# Patient Record
Sex: Male | Born: 1990 | Race: Black or African American | Hispanic: No | Marital: Single | Smoking: Never smoker
Health system: Southern US, Community
[De-identification: ages and names within clinical notes are randomized; demographics above are authoritative.]

## PROBLEM LIST (undated history)

## (undated) DIAGNOSIS — R011 Cardiac murmur, unspecified: Secondary | ICD-10-CM

---

## 2011-01-18 ENCOUNTER — Other Ambulatory Visit: Payer: Self-pay

## 2011-01-18 ENCOUNTER — Emergency Department (HOSPITAL_COMMUNITY)
Admission: EM | Admit: 2011-01-18 | Discharge: 2011-01-19 | Disposition: A | Discharge status: Home / Self Care | Payer: BC Managed Care – PPO | Attending: Emergency Medicine | Admitting: Emergency Medicine

## 2011-01-18 ENCOUNTER — Encounter (HOSPITAL_COMMUNITY): Payer: Self-pay

## 2011-01-18 DIAGNOSIS — R079 Chest pain, unspecified: Secondary | ICD-10-CM | POA: Insufficient documentation

## 2011-01-18 DIAGNOSIS — R0789 Other chest pain: Secondary | ICD-10-CM

## 2011-01-18 DIAGNOSIS — R071 Chest pain on breathing: Secondary | ICD-10-CM | POA: Insufficient documentation

## 2011-01-18 HISTORY — DX: Cardiac murmur, unspecified: R01.1

## 2011-01-18 NOTE — ED Notes (Signed)
Pt st's he got a sharp pain in his upper abdomen/lower chest right after eating fish and fries, st's the pain was sharp in nature, radiated up to his L shoulder and went away seconds later.  Reports no SOB, no n/v, no dizziness or lightheadedness.  Pt is not in any pain right now, NAD.

## 2011-01-19 ENCOUNTER — Emergency Department (HOSPITAL_COMMUNITY): Payer: BC Managed Care – PPO

## 2011-01-19 NOTE — ED Provider Notes (Signed)
History     CSN: [id number]  Arrival date & time 01/18/11  2222   First MD Initiated Contact with Patient 01/19/11 0025      Chief Complaint  Patient presents with  . Chest Pain    x 1 episode. was watching TV. sharp pain after eating fried seafood. no other sx's.  currently denies pain or other sx's    (Consider location/radiation/quality/duration/timing/severity/associated sxs/prior treatment) Patient is a 21 y.o. male presenting with chest pain. The history is provided by the patient. No language interpreter was used.  Chest Pain The chest pain began less than 1 hour ago. Chest pain occurs rarely. The chest pain is resolved. The pain is associated with eating. At its most intense, the pain is at 8/10. The pain is currently at 0/10. The quality of the pain is described as sharp. The pain does not radiate. Exacerbated by: nothing. Pertinent negatives for primary symptoms include no fever, no fatigue, no syncope, no shortness of breath, no cough, no wheezing, no palpitations, no abdominal pain, no nausea, no vomiting, no dizziness and no altered mental status.  Pertinent negatives for associated symptoms include no lower extremity edema. He tried nothing for the symptoms. Risk factors include obesity.  Pertinent negatives for past medical history include no Marfan's syndrome.  Pertinent negatives for family medical history include: no Marfan's syndrome in family.  Procedure history is negative for cardiac catheterization.   PERC negative, no leg pain or swelling no long car trips or plane trips.    Past Medical History  Diagnosis Date  . Murmur, heart     mother states was tested as a child-unsure if he has or not.    History reviewed. No pertinent past surgical history.  History reviewed. No pertinent family history.  History  Substance Use Topics  . Smoking status: Never Smoker   . Smokeless tobacco: Not on file  . Alcohol Use: No      Review of Systems    Constitutional: Negative for fever and fatigue.  HENT: Negative.   Eyes: Negative.   Respiratory: Negative for cough, shortness of breath and wheezing.   Cardiovascular: Positive for chest pain. Negative for palpitations and syncope.  Gastrointestinal: Negative for nausea, vomiting, abdominal pain and abdominal distention.  Genitourinary: Negative.   Musculoskeletal: Negative.   Neurological: Negative for dizziness and syncope.  Hematological: Negative.   Psychiatric/Behavioral: Negative.  Negative for altered mental status.    Allergies  Review of patient's allergies indicates no known allergies.  Home Medications  No current outpatient prescriptions on file.  BP 146/92  Pulse 95  Temp(Src) 99.1 F (37.3 C) (Oral)  Resp 18  Ht 6\' 1"  (1.854 m)  Wt 275 lb (124.739 kg)  BMI 36.28 kg/m2  SpO2 100%  Physical Exam  Constitutional: He is oriented to person, place, and time. He appears well-developed and well-nourished. No distress.  HENT:  Head: Normocephalic and atraumatic.  Mouth/Throat: Oropharynx is clear and moist.  Eyes: Conjunctivae are normal. Pupils are equal, round, and reactive to light.  Neck: Normal range of motion. Neck supple. No JVD present.  Cardiovascular: Normal rate and regular rhythm.   Pulmonary/Chest: Effort normal and breath sounds normal. He has no wheezes. He has no rales.  Abdominal: Soft. Bowel sounds are normal. There is no tenderness. There is no rebound and no guarding.  Musculoskeletal: Normal range of motion. He exhibits no tenderness.  Neurological: He is alert and oriented to person, place, and time.  Skin: Skin is warm  and dry. He is not diaphoretic.  Psychiatric: Thought content normal.    ED Course  Procedures (including critical care time)  Labs Reviewed - No data to display No results found.   No diagnosis found.    MDM   Date: 01/19/2011  Rate: 91  Rhythm: normal sinus rhythm  QRS Axis: normal  Intervals: normal  ST/T  Wave abnormalities: normal  Conduction Disutrbances:none  Narrative Interpretation:   Old EKG Reviewed: none available        Return for chest pain shortness of breath or any concerns.  Mom and patient verbalize understanding and agree to follow up  [name] K Marella Vanderpol-Rasch, MD 01/19/11 [phone]

## 2013-01-18 IMAGING — CR DG CHEST 2V
2 series · 2 of 2 positions shown · non-contrast
Comparison: None

CLINICAL DATA: Left chest cramping

CHEST - 2 VIEW

[w chest pa]
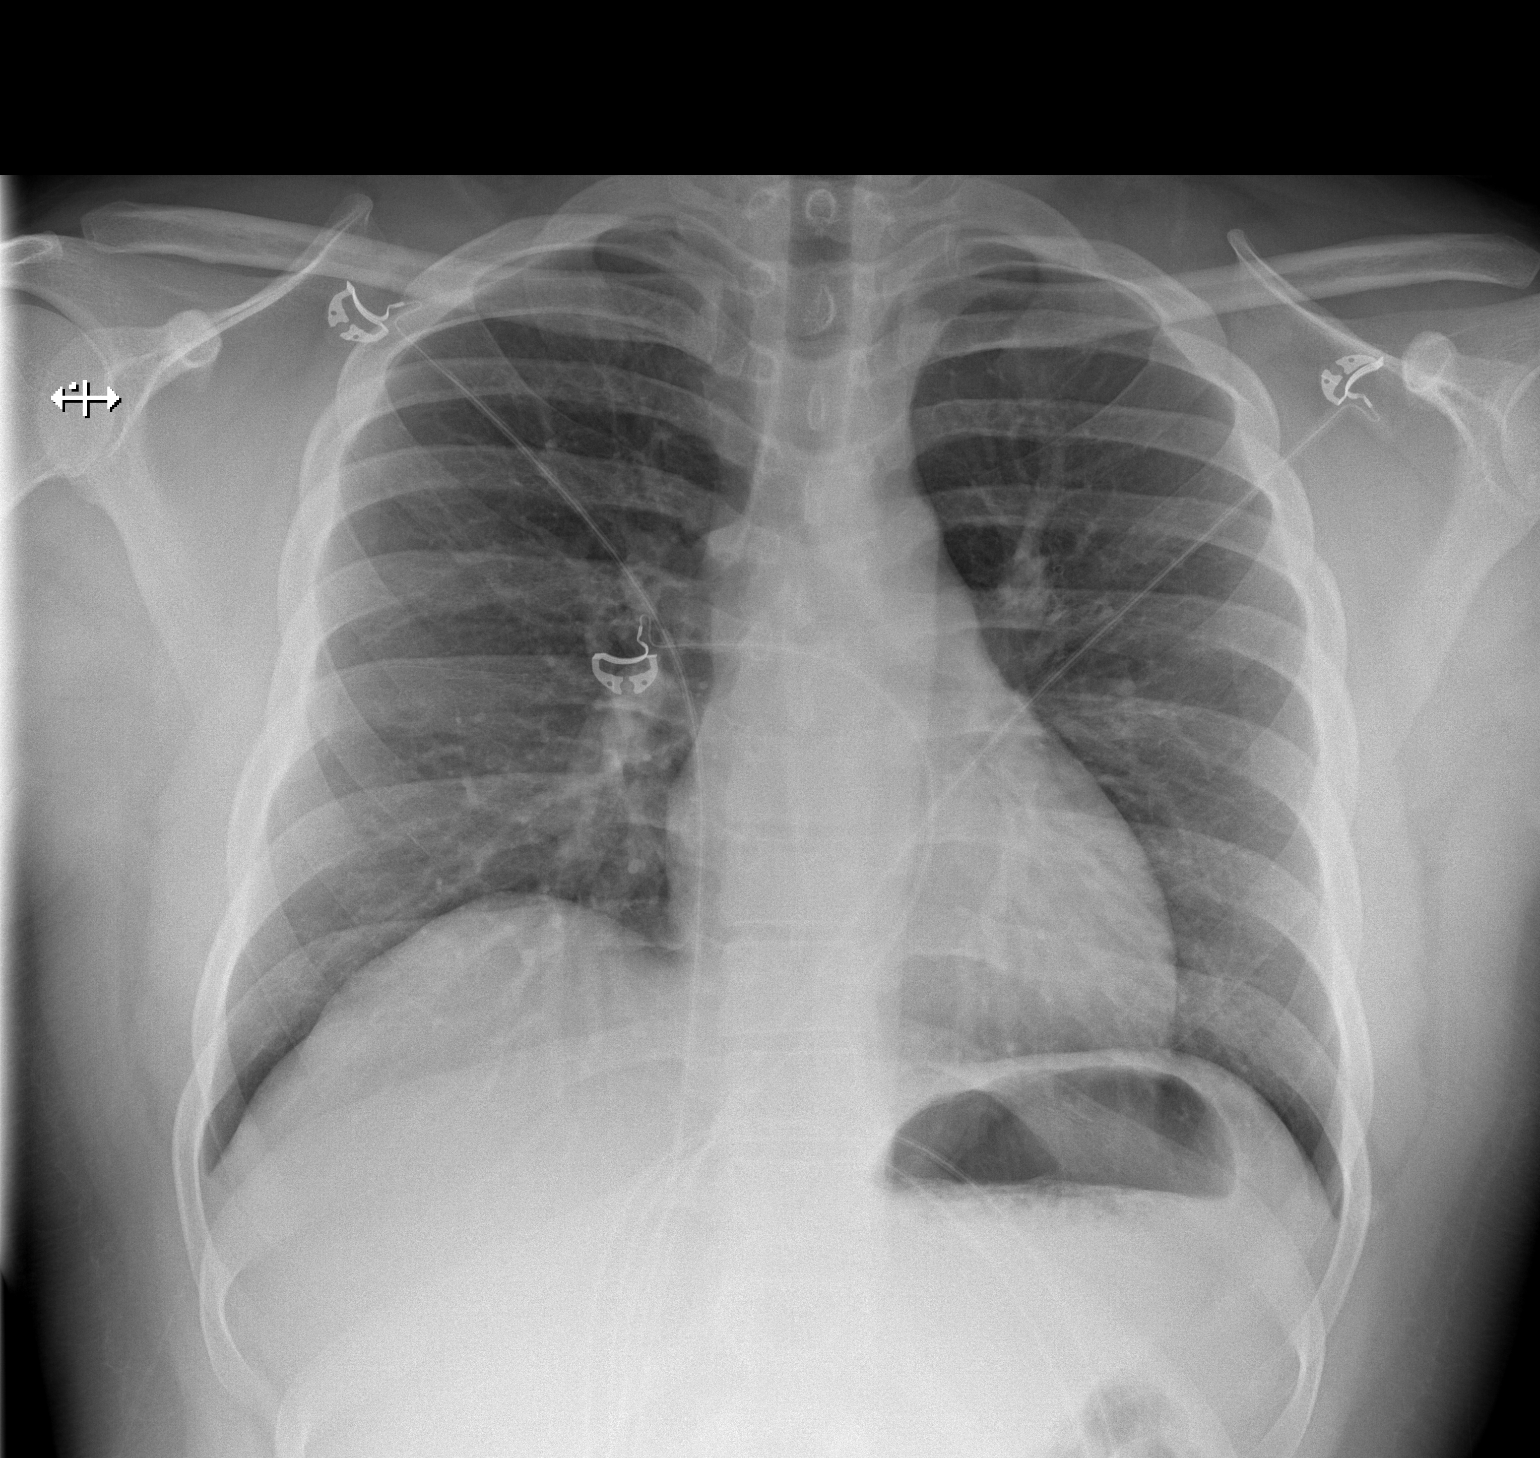

[w chest lat]
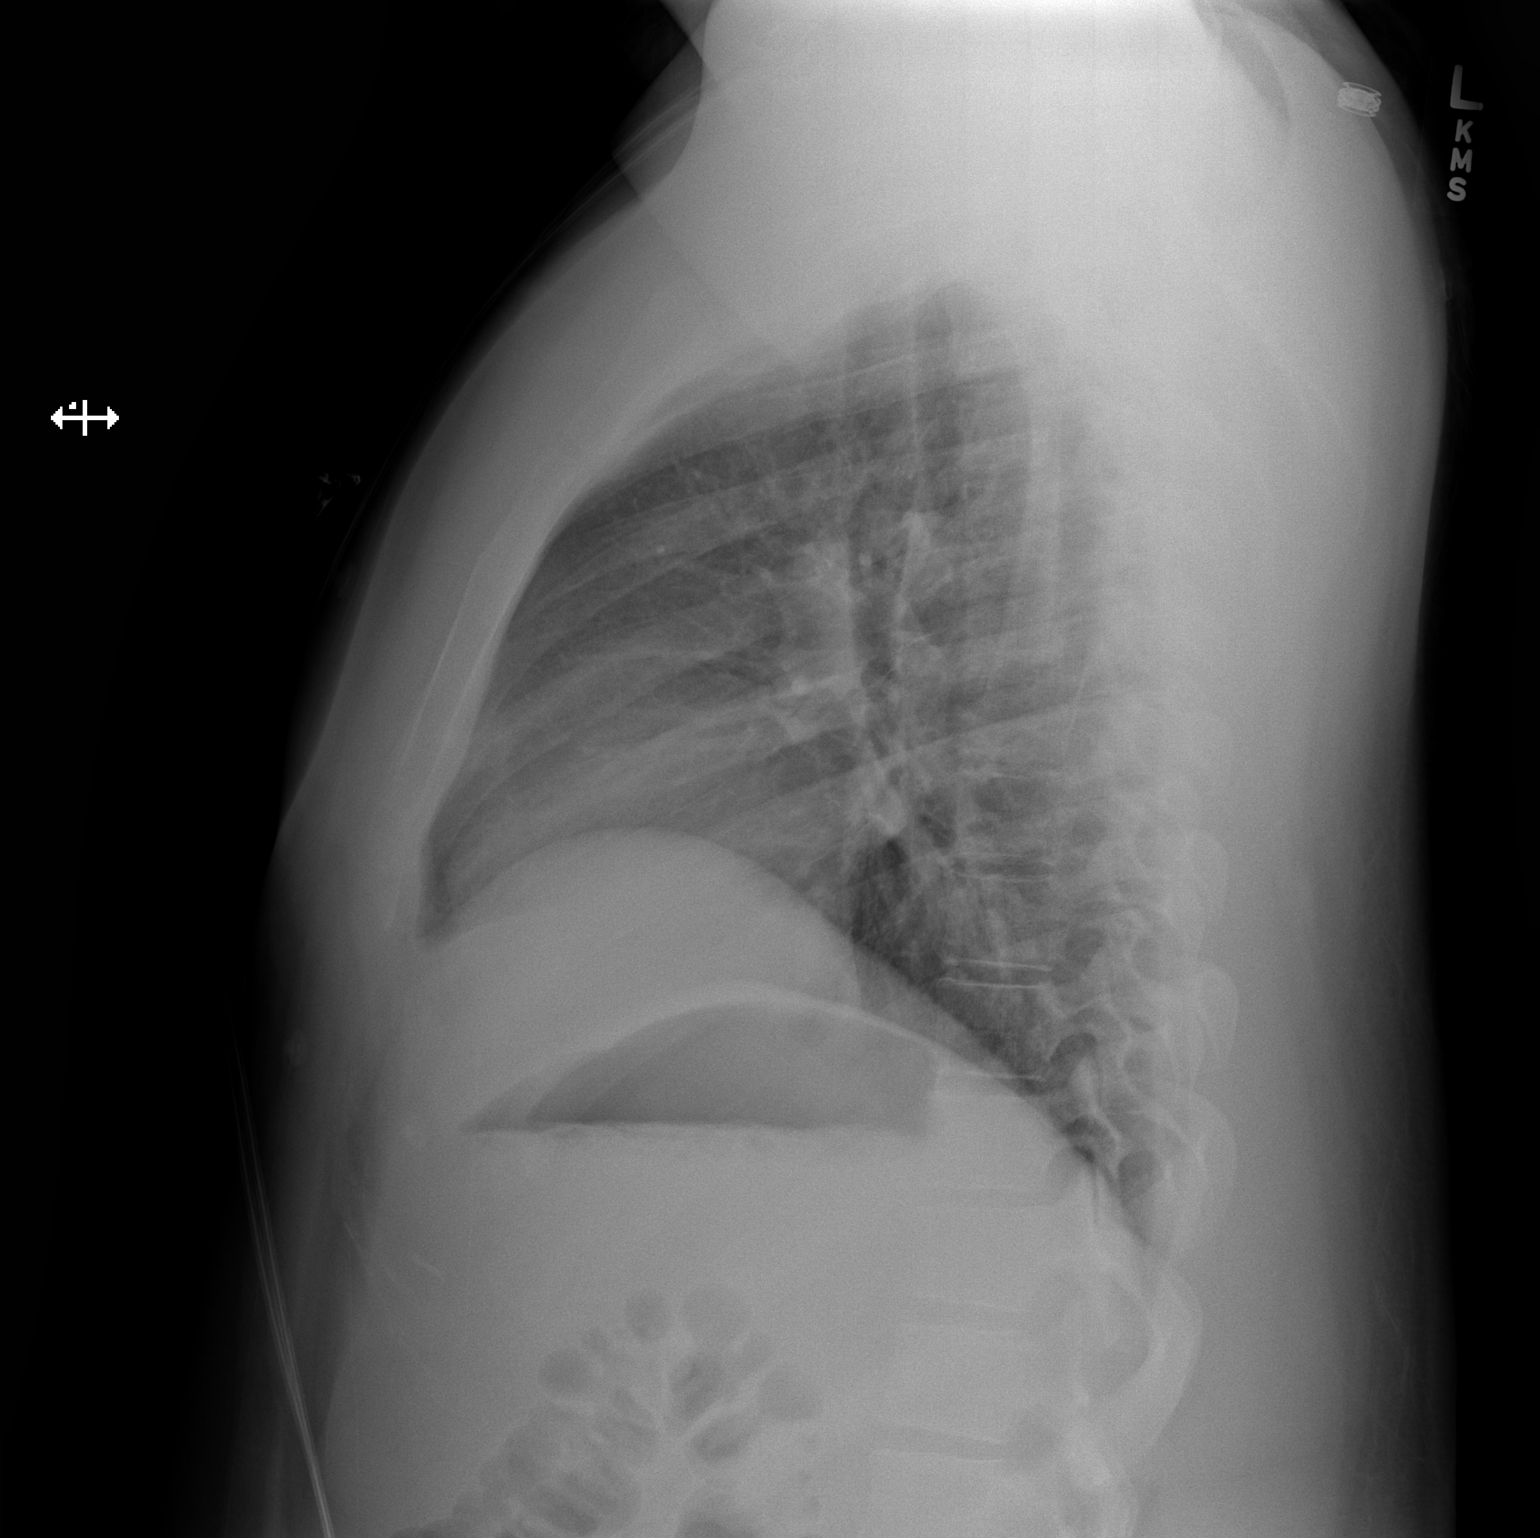

[2 of 2 positions shown; findings below may reference images not displayed]

FINDINGS: Normal heart size, mediastinal contours, and pulmonary vascularity.
Lungs clear.
No pleural effusion or pneumothorax.
Bones unremarkable.
IMPRESSION: No acute abnormalities.

## 2014-06-19 ENCOUNTER — Other Ambulatory Visit: Payer: Self-pay | Admitting: Urology

## 2021-03-06 ENCOUNTER — Encounter: Payer: Self-pay | Admitting: Emergency Medicine

## 2021-03-06 ENCOUNTER — Ambulatory Visit
Admission: EM | Admit: 2021-03-06 | Discharge: 2021-03-06 | Disposition: A | Discharge status: Home / Self Care | Payer: Self-pay | Attending: Emergency Medicine | Admitting: Emergency Medicine

## 2021-03-06 ENCOUNTER — Other Ambulatory Visit: Payer: Self-pay

## 2021-03-06 DIAGNOSIS — K05219 Aggressive periodontitis, localized, unspecified severity: Secondary | ICD-10-CM

## 2021-03-06 DIAGNOSIS — K047 Periapical abscess without sinus: Secondary | ICD-10-CM

## 2021-03-06 MED ORDER — PENICILLIN V POTASSIUM 500 MG PO TABS: 500.0000 mg | ORAL_TABLET | Freq: Three times a day (TID) | ORAL | 0 refills | Status: AC

## 2021-03-06 MED ORDER — IBUPROFEN 800 MG PO TABS: 800.0000 mg | ORAL_TABLET | Freq: Three times a day (TID) | ORAL | 0 refills | Status: DC | PRN

## 2021-03-06 NOTE — ED Triage Notes (Signed)
Pt c/o right lower dental pain since yesterday. Took home remedy to help with pain

## 2021-03-06 NOTE — ED Provider Notes (Signed)
[facility]    CSN: [id number] Arrival date & time: 03/06/21  1044    HISTORY   Chief Complaint  Patient presents with   Dental Pain   HPI Tyler Byrd is a pleasant a 31 y.o. male. Patient complains in his right lower first molar with some mild swelling of his gums around that same tooth.  Patient states he tried some home remedies for his pain without relief.  Patient denies fever, aches, chills, facial swelling, pain with chewing, broken tooth.  Patient states he does not currently have a dentist but recently obtained dental insurance and is looking for one this time.  The history is provided by the patient.  Past Medical History:  Diagnosis Date   Murmur, heart    mother states was tested as a child-unsure if he has or not.   There are no problems to display for this patient.  History reviewed. No pertinent surgical history.  Home Medications    Prior to Admission medications   Not on File   Family History No family history on file. Social History Social History   Tobacco Use   Smoking status: Never  Substance Use Topics   Alcohol use: No   Drug use: No   Allergies   Patient has no known allergies.  Review of Systems Review of Systems Pertinent findings noted in history of present illness.   Physical Exam Triage Vital Signs ED Triage Vitals  Enc Vitals Group     BP 11/03/20 0827 (!) 147/82     Pulse Rate 11/03/20 0827 72     Resp 11/03/20 0827 18     Temp 11/03/20 0827 98.3 F (36.8 C)     Temp Source 11/03/20 0827 Oral     SpO2 11/03/20 0827 98 %     Weight --      Height --      Head Circumference --      Peak Flow --      Pain Score 11/03/20 0826 5     Pain Loc --      Pain Edu? --      Excl. in [place]? --   No data found.  Updated Vital Signs BP 139/87 (BP Location: Left Arm)    Pulse 84    Temp 98.7 F (37.1 C) (Oral)    Resp 17    SpO2 98%   Physical Exam Vitals and nursing note reviewed.  Constitutional:      General: He  is not in acute distress.    Appearance: Normal appearance. He is not ill-appearing.  HENT:     Head: Normocephalic and atraumatic.     Mouth/Throat:   Eyes:     General: Lids are normal.        Right eye: No discharge.        Left eye: No discharge.     Extraocular Movements: Extraocular movements intact.     Conjunctiva/sclera: Conjunctivae normal.     Right eye: Right conjunctiva is not injected.     Left eye: Left conjunctiva is not injected.  Neck:     Trachea: Trachea and phonation normal.  Cardiovascular:     Rate and Rhythm: Normal rate and regular rhythm.     Pulses: Normal pulses.     Heart sounds: Normal heart sounds. No murmur heard.   No friction rub. No gallop.  Pulmonary:     Effort: Pulmonary effort is normal. No accessory muscle usage, prolonged expiration or respiratory distress.  Breath sounds: Normal breath sounds. No stridor, decreased air movement or transmitted upper airway sounds. No decreased breath sounds, wheezing, rhonchi or rales.  Chest:     Chest wall: No tenderness.  Musculoskeletal:        General: Normal range of motion.     Cervical back: Normal range of motion and neck supple. Normal range of motion.  Lymphadenopathy:     Cervical: No cervical adenopathy.  Skin:    General: Skin is warm and dry.     Findings: No erythema or rash.  Neurological:     General: No focal deficit present.     Mental Status: He is alert and oriented to person, place, and time.  Psychiatric:        Mood and Affect: Mood normal.        Behavior: Behavior normal.    Visual Acuity Right Eye Distance:   Left Eye Distance:   Bilateral Distance:    Right Eye Near:   Left Eye Near:    Bilateral Near:     UC Couse / Diagnostics / Procedures:    EKG  Radiology No results found.  Procedures Procedures (including critical care time)  UC Diagnoses / Final Clinical Impressions(s)   I have reviewed the triage vital signs and the nursing  notes.  Pertinent labs & imaging results that were available during my care of the patient were reviewed by me and considered in my medical decision making (see chart for details).   Final diagnoses:  Infected tooth  Gingival abscess   Patient provided with ibuprofen for pain and penicillin to eradicate bacteria in the dental root.  Patient advised to obtain an appointment with a dentist in the next 2 to 4 weeks to have the tooth or teeth addressed. ED Prescriptions     Medication Sig Dispense Auth. Provider   penicillin v potassium (VEETID) 500 MG tablet Take 1 tablet (500 mg total) by mouth 3 (three) times daily for 14 days. 42 tablet Lynden Oxford Scales, PA-C   ibuprofen (ADVIL) 800 MG tablet Take 1 tablet (800 mg total) by mouth every 8 (eight) hours as needed for up to 21 doses for fever, headache, mild pain or moderate pain. 21 tablet Lynden Oxford Scales, PA-C      PDMP not reviewed this encounter.  Pending results:  Labs Reviewed - No data to display  Medications Ordered in UC: Medications - No data to display  Disposition Upon Discharge:  Condition: stable for discharge home Home: take medications as prescribed; routine discharge instructions as discussed; follow up as advised.  Patient presented with an acute illness with associated systemic symptoms and significant discomfort requiring urgent management. In my opinion, this is a condition that a prudent lay person (someone who possesses an average knowledge of health and medicine) may potentially expect to result in complications if not addressed urgently such as respiratory distress, impairment of bodily function or dysfunction of bodily organs.   Routine symptom specific, illness specific and/or disease specific instructions were discussed with the patient and/or caregiver at length.   As such, the patient has been evaluated and assessed, work-up was performed and treatment was provided in alignment with urgent care  protocols and evidence based medicine.  Patient/parent/caregiver has been advised that the patient may require follow up for further testing and treatment if the symptoms continue in spite of treatment, as clinically indicated and appropriate.  If the patient was tested for COVID-19, Influenza and/or RSV, then the patient/parent/guardian was  advised to isolate at home pending the results of his/her diagnostic coronavirus test and potentially longer if theyre positive. I have also advised pt that if his/her COVID-19 test returns positive, it's recommended to self-isolate for at least 10 days after symptoms first appeared AND until fever-free for 24 hours without fever reducer AND other symptoms have improved or resolved. Discussed self-isolation recommendations as well as instructions for household member/close contacts as per the [facility] and [employer] DHHS, and also gave patient the [place] packet with this information.  Patient/parent/caregiver has been advised to return to the [facility] or PCP in 3-5 days if no better; to PCP or the Emergency Department if new signs and symptoms develop, or if the current signs or symptoms continue to change or worsen for further workup, evaluation and treatment as clinically indicated and appropriate  The patient will follow up with their current PCP if and as advised. If the patient does not currently have a PCP we will assist them in obtaining one.   The patient may need specialty follow up if the symptoms continue, in spite of conservative treatment and management, for further workup, evaluation, consultation and treatment as clinically indicated and appropriate.  Patient/parent/caregiver verbalized understanding and agreement of plan as discussed.  All questions were addressed during visit.  Please see discharge instructions below for further details of plan.  Discharge Instructions:   Discharge Instructions      To aggressively treat any bacterial infection at the root of  your bottom right tooth, please begin penicillin, 1 tablet 3 times daily for the next 14 days.  It is important that you take all of these antibiotics exactly as prescribed.    If you decide that you you are feeling better and decide to discontinue your antibiotics before completing the full 14 day course of treatment, you run the risk of leaving resistant bacteria behind and that dental pocket which will cause a much more serious, difficult to treat infection.  Thank you for your understanding.  Please be sure you have a dental appointment scheduled sometime within the next 2 to 4 weeks for the best chance of saving your tooth.    Thank you for visiting [place] here urgent care.  We appreciate the opportunity to participate in your care.    This office note has been dictated using Museum/gallery curator.  Unfortunately, and despite my best efforts, this method of dictation can sometimes lead to occasional typographical or grammatical errors.  I apologize in advance if this occurs.     Lynden Oxford Scales, [place] 03/07/21 [phone]

## 2021-03-06 NOTE — Discharge Instructions (Addendum)
To aggressively treat any bacterial infection at the root of your bottom right tooth, please begin penicillin, 1 tablet 3 times daily for the next 14 days.  It is important that you take all of these antibiotics exactly as prescribed.    If you decide that you you are feeling better and decide to discontinue your antibiotics before completing the full 14 day course of treatment, you run the risk of leaving resistant bacteria behind and that dental pocket which will cause a much more serious, difficult to treat infection.  Thank you for your understanding.  Please be sure you have a dental appointment scheduled sometime within the next 2 to 4 weeks for the best chance of saving your tooth.    Thank you for visiting [place] here urgent care.  We appreciate the opportunity to participate in your care.

## 2024-01-08 ENCOUNTER — Other Ambulatory Visit: Payer: Self-pay

## 2024-01-08 ENCOUNTER — Ambulatory Visit
Admission: EM | Admit: 2024-01-08 | Discharge: 2024-01-08 | Disposition: A | Discharge status: Home / Self Care | Payer: Self-pay | Attending: Internal Medicine | Admitting: Internal Medicine

## 2024-01-08 DIAGNOSIS — Z20822 Contact with and (suspected) exposure to covid-19: Secondary | ICD-10-CM

## 2024-01-08 DIAGNOSIS — R0981 Nasal congestion: Secondary | ICD-10-CM

## 2024-01-08 DIAGNOSIS — R5383 Other fatigue: Secondary | ICD-10-CM

## 2024-01-08 LAB — POCT INFLUENZA A/B
Influenza A, POC: NEGATIVE
Influenza B, POC: NEGATIVE

## 2024-01-08 LAB — POC SOFIA SARS ANTIGEN FIA: SARS Coronavirus 2 Ag: NEGATIVE

## 2024-01-08 NOTE — Discharge Instructions (Addendum)
 Your flu and COVID tests were negative. Your symptoms appear consistent with a viral respiratory illness. Recommend symptomatic treatment with nasal spray (Flonase) for congestion and Tylenol/Ibuprofen as needed for fevers/aches. Return in 3-4 days if no improvement. If new or worsening symptoms such as presistent fevers, difficulty breathing, shortness of breath, or chest pain, go directly to the ER.

## 2024-01-08 NOTE — ED Provider Notes (Signed)
 " [facility]  Note:  This document was prepared using Dragon voice recognition software and may include unintentional dictation errors.  MRN: [id number] DOB: [birth year] DATE: 01/08/2024   Subjective:  Chief Complaint:  Chief Complaint  Patient presents with   Nasal Congestion     HPI: Tyler Byrd is a 34 y.o. male presenting for congestion and fatigue for 2 days. Patient states son and girlfriend tested positive for COVID. He reports fatigue and nasal congestion starting yesterday. Reports scratchy throat as well today. He reports taking dayquil with some relief. Denies fever, nausea/vomiting, abdominal pain, cough, otalgia. Endorses congestion, fatigue. Presents NAD.  Prior to Admission medications  Not on File     Allergies[1]  History:   Past Medical History:  Diagnosis Date   Murmur, heart    mother states was tested as a child-unsure if he has or not.     History reviewed. No pertinent surgical history.  History reviewed. No pertinent family history.  Social History[2]  Review of Systems  Constitutional:  Positive for fatigue. Negative for fever.  HENT:  Positive for congestion, postnasal drip and sore throat. Negative for ear pain.   Respiratory:  Negative for cough.   Gastrointestinal:  Negative for abdominal pain, nausea and vomiting.  Musculoskeletal:  Positive for myalgias.     Objective:   Vitals: BP 139/83 (BP Location: Right Arm)   Pulse 89   Temp 100.2 F (37.9 C) (Oral)   Resp 18   SpO2 98%   Physical Exam Constitutional:      General: He is not in acute distress.    Appearance: Normal appearance. He is well-developed. He is obese. He is not ill-appearing or toxic-appearing.  HENT:     Head: Normocephalic and atraumatic.     Right Ear: Tympanic membrane and ear canal normal.     Left Ear: Tympanic membrane and ear canal normal.     Mouth/Throat:     Pharynx: Uvula midline. Posterior oropharyngeal erythema present. No pharyngeal  swelling or oropharyngeal exudate.     Tonsils: No tonsillar exudate or tonsillar abscesses.  Cardiovascular:     Rate and Rhythm: Normal rate and regular rhythm.     Heart sounds: Normal heart sounds.  Pulmonary:     Effort: Pulmonary effort is normal.     Breath sounds: Normal breath sounds.     Comments: Clear to auscultation bilaterally   Abdominal:     General: Bowel sounds are normal.     Palpations: Abdomen is soft.     Tenderness: There is no abdominal tenderness.  Skin:    General: Skin is warm and dry.  Neurological:     General: No focal deficit present.     Mental Status: He is alert.  Psychiatric:        Mood and Affect: Mood and affect normal.     Results:  Labs: No results found for this or any previous visit (from the past 24 hours).  Radiology: No results found.   UC Course/Treatments:  Procedures: Procedures   Medications Ordered in UC: Medications - No data to display   Assessment and Plan :     ICD-10-CM   1. Nasal congestion  R09.81     2. Exposure to COVID-19 virus  Z20.822     3. Fatigue, unspecified type  R53.83       Nasal congestion Exposure to COVID-19 virus Fatigue, unspecified type Afebrile, nontoxic-appearing, NAD. VSS. DDX includes but not limited to: COVID, flu,  viral URI, sinusitis, allergic rhinitis COVID and flu were negative. Suspect viral etiology. Recommend symptomatic treatment. Recommend OTC analgesics as needed for pain/fevers. Recommend OTC nasal spray and decongestants for nasal congestion. Encouraged rest and fluids. Strict ED precautions were given and patient verbalized understanding.  ED Discharge Orders     None        PDMP not reviewed this encounter.      [1] No Known Allergies [2]  Social History Tobacco Use   Smoking status: Never  Substance Use Topics   Alcohol use: No   Drug use: No     Jeny Nield P, PA-C 01/08/24 1637  "

## 2024-01-08 NOTE — ED Triage Notes (Signed)
 C/O fatigue, body aches and congestion since yesterday. Patient states exposure to covid. Denies fever and cough. Patient took dayquil at 0800 am.
